# Patient Record
Sex: Male | Born: 1950 | Race: White | Hispanic: No
Health system: Southern US, Community
[De-identification: ages and names within clinical notes are randomized; demographics above are authoritative.]

---

## 2018-07-26 ENCOUNTER — Encounter: Payer: Self-pay | Admitting: Radiology

## 2018-07-26 ENCOUNTER — Emergency Department
Admission: EM | Admit: 2018-07-26 | Discharge: 2018-07-26 | Disposition: A | Discharge status: Home / Self Care | Payer: Medicare PPO | Attending: Emergency Medicine | Admitting: Emergency Medicine

## 2018-07-26 ENCOUNTER — Other Ambulatory Visit: Payer: Self-pay

## 2018-07-26 ENCOUNTER — Emergency Department: Payer: Medicare PPO

## 2018-07-26 DIAGNOSIS — N200 Calculus of kidney: Secondary | ICD-10-CM | POA: Diagnosis not present

## 2018-07-26 DIAGNOSIS — E039 Hypothyroidism, unspecified: Secondary | ICD-10-CM | POA: Diagnosis not present

## 2018-07-26 DIAGNOSIS — R1032 Left lower quadrant pain: Secondary | ICD-10-CM | POA: Diagnosis present

## 2018-07-26 LAB — CBC WITH DIFFERENTIAL/PLATELET
BASOS PCT: 1 %
Basophils Absolute: 0 10*3/uL (ref 0–0.1)
EOS ABS: 0.1 10*3/uL (ref 0–0.7)
Eosinophils Relative: 1 %
HCT: 47 % (ref 40.0–52.0)
HEMOGLOBIN: 16.5 g/dL (ref 13.0–18.0)
Lymphocytes Relative: 19 %
Lymphs Abs: 1.2 10*3/uL (ref 1.0–3.6)
MCH: 31.9 pg (ref 26.0–34.0)
MCHC: 35.1 g/dL (ref 32.0–36.0)
MCV: 90.8 fL (ref 80.0–100.0)
MONOS PCT: 9 %
Monocytes Absolute: 0.6 10*3/uL (ref 0.2–1.0)
NEUTROS PCT: 70 %
Neutro Abs: 4.5 10*3/uL (ref 1.4–6.5)
PLATELETS: 260 10*3/uL (ref 150–440)
RBC: 5.17 MIL/uL (ref 4.40–5.90)
RDW: 13.6 % (ref 11.5–14.5)
WBC: 6.4 10*3/uL (ref 3.8–10.6)

## 2018-07-26 LAB — BASIC METABOLIC PANEL
ANION GAP: 6 (ref 5–15)
BUN: 15 mg/dL (ref 8–23)
CALCIUM: 8.5 mg/dL — AB (ref 8.9–10.3)
CO2: 26 mmol/L (ref 22–32)
CREATININE: 1.14 mg/dL (ref 0.61–1.24)
Chloride: 107 mmol/L (ref 98–111)
GLUCOSE: 112 mg/dL — AB (ref 70–99)
Potassium: 3.9 mmol/L (ref 3.5–5.1)
Sodium: 139 mmol/L (ref 135–145)

## 2018-07-26 LAB — HEPATIC FUNCTION PANEL
ALT: 22 U/L (ref 0–44)
AST: 26 U/L (ref 15–41)
Albumin: 3.7 g/dL (ref 3.5–5.0)
Alkaline Phosphatase: 53 U/L (ref 38–126)
BILIRUBIN DIRECT: 0.1 mg/dL (ref 0.0–0.2)
BILIRUBIN INDIRECT: 0.7 mg/dL (ref 0.3–0.9)
BILIRUBIN TOTAL: 0.8 mg/dL (ref 0.3–1.2)
Total Protein: 6.9 g/dL (ref 6.5–8.1)

## 2018-07-26 LAB — URINALYSIS, COMPLETE (UACMP) WITH MICROSCOPIC
BACTERIA UA: NONE SEEN
BILIRUBIN URINE: NEGATIVE
GLUCOSE, UA: NEGATIVE mg/dL
Ketones, ur: NEGATIVE mg/dL
LEUKOCYTES UA: NEGATIVE
Nitrite: NEGATIVE
PROTEIN: NEGATIVE mg/dL
Specific Gravity, Urine: 1.026 (ref 1.005–1.030)
Squamous Epithelial / LPF: NONE SEEN (ref 0–5)
pH: 5 (ref 5.0–8.0)

## 2018-07-26 LAB — LIPASE, BLOOD: Lipase: 25 U/L (ref 11–51)

## 2018-07-26 MED ORDER — HYDROCODONE-ACETAMINOPHEN 5-325 MG PO TABS: 1.0000 | ORAL_TABLET | Freq: Four times a day (QID) | ORAL | 0 refills | Status: AC | PRN

## 2018-07-26 MED ORDER — TAMSULOSIN HCL 0.4 MG PO CAPS: 0.4000 mg | ORAL_CAPSULE | Freq: Every day | ORAL | 0 refills | Status: AC

## 2018-07-26 MED ORDER — IOPAMIDOL (ISOVUE-300) INJECTION 61%
30.0000 mL | Freq: Once | INTRAVENOUS | Status: AC | PRN
  Administered 2018-07-26: 30 mL via ORAL

## 2018-07-26 MED ORDER — ONDANSETRON 4 MG PO TBDP: 4.0000 mg | ORAL_TABLET | Freq: Four times a day (QID) | ORAL | 0 refills | Status: AC | PRN

## 2018-07-26 MED ORDER — IOPAMIDOL (ISOVUE-300) INJECTION 61%
100.0000 mL | Freq: Once | INTRAVENOUS | Status: AC | PRN
  Administered 2018-07-26: 100 mL via INTRAVENOUS

## 2018-07-26 MED ORDER — NAPROXEN 500 MG PO TABS
250.0000 mg | ORAL_TABLET | Freq: Once | ORAL | Status: AC
  Administered 2018-07-26: 250 mg via ORAL
  Filled 2018-07-26: qty 1

## 2018-07-26 NOTE — ED Triage Notes (Signed)
Pt ambulatory to triage with no difficulty. Pt reports left flank and abd pain that started yesterday as a feeling of gas. Pt reports pain has become more intense. Hx of kidney stones. Feels similar.

## 2018-07-26 NOTE — ED Notes (Signed)
Pt called out stating he finished drinking his contrast. CT notified. Pt denies any needs at this time.

## 2018-07-26 NOTE — Discharge Instructions (Addendum)
You have been seen in the Emergency Department (ED) today for pain that we believe based on your workup, is caused by kidney stones.  As we have discussed, please drink plenty of fluids.  Please make a follow up appointment with the physician(s) listed elsewhere in this documentation. ° °You may take pain medication as needed but ONLY as prescribed.  Please also take your prescribed Flomax daily.  We also recommend that you take over-the-counter ibuprofen regularly according to label instructions over the next 5 days.  Take it with meals to minimize stomach discomfort. ° °Please see your doctor as soon as possible as stones may take 1-3 weeks to pass and you may require additional care or medications. ° °Do not drink alcohol, drive or participate in any other potentially dangerous activities while taking opiate pain medication as it may make you sleepy. Do not take this medication with any other sedating medications, either prescription or over-the-counter. If you were prescribed Percocet or Vicodin, do not take these with acetaminophen (Tylenol) as it is already contained within these medications. °  °This medication is an opiate (or narcotic) pain medication and can be habit forming.  Use it as little as possible to achieve adequate pain control.  Do not use or use it with extreme caution if you have a history of opiate abuse or dependence.  If you are on a pain contract with your primary care doctor or a pain specialist, be sure to let them know you were prescribed this medication today from the [facility].  This medication is intended for your use only - do not give any to anyone else and keep it in a secure place where nobody else, especially children, have access to it.  It will also cause or worsen constipation, so you may want to consider taking an over-the-counter stool softener while you are taking this medication. ° °Return to the Emergency Department (ED) or call your doctor  if you have any worsening pain, fever, painful urination, are unable to urinate, or develop other symptoms that concern you. ° °

## 2018-07-26 NOTE — ED Notes (Signed)
Pt reports he developed a sharp pain to left flank area yesterday reports it went away and woke up this morning around 0400 with sharp pain, reports history of kidney stones reports pain feels similar to prior experiences. Pt talks in complete sentences no distress noted denies any blood in urine denies any N/V or D.

## 2018-07-26 NOTE — ED Provider Notes (Signed)
[facility] Emergency Department Provider Note   ____________________________________________   First MD Initiated Contact with Patient 07/26/18 [phone]     (approximate)  I have reviewed the triage vital signs and the nursing notes.   HISTORY  Chief Complaint Abdominal Pain and Flank Pain    HPI Chris Mcfarland is a 67 y.o. male reports abdominal pain for about 2 days  Left mid to left lower abdomen.  Been ongoing for about 2 days, somewhat crampy in nature.  Taking Naprosyn with moderate relief.  Reports he would prefer to be able to drive home, currently not in a lot of pain but is slightly uncomfortable.  Reports similar he thinks about 5 years ago when he had a kidney stone that required stenting for removal.  Denies a history of diverticulitis.  Does frequent diving, no recent dives.  Previous appendectomy 40 years ago.  Pain is somewhat sharp at times.  No nausea vomiting.  Small but normal bowel movement yesterday.  No black or bloody stool.  No history of ulcers.  Taking Synthroid   History reviewed. No pertinent past medical history.  Hypothyroidism  Myalgias  Prior seizures  There are no active problems to display for this patient.     Prior to Admission medications   Medication Sig Start Date End Date Taking? Authorizing Provider  HYDROcodone-acetaminophen (NORCO/VICODIN) 5-325 MG tablet Take 1 tablet by mouth every 6 (six) hours as needed for moderate pain. 07/26/18   Sharyn Creamer, MD  ondansetron (ZOFRAN ODT) 4 MG disintegrating tablet Take 1 tablet (4 mg total) by mouth every 6 (six) hours as needed for nausea or vomiting. 07/26/18   Sharyn Creamer, MD  tamsulosin (FLOMAX) 0.4 MG CAPS capsule Take 1 capsule (0.4 mg total) by mouth daily. 07/26/18   Sharyn Creamer, MD    Allergies Aspirin  No family history on file.  Social History Social History   Tobacco Use  . Smoking status: Not on file  Substance Use Topics  . Alcohol use: Not on  file  . Drug use: Not on file  Denies illicit drug use, denies overuse of alcohol  Review of Systems Constitutional: No fever/chills Eyes: No visual changes. ENT: No sore throat. Cardiovascular: Denies chest pain. Respiratory: Denies shortness of breath. Gastrointestinal: No nausea, no vomiting.  No diarrhea.  No constipation. Genitourinary: Negative for dysuria. Musculoskeletal: Negative for back pain. Skin: Negative for rash. Neurological: Negative for headaches, focal weakness or numbness.    ____________________________________________   PHYSICAL EXAM:  VITAL SIGNS: ED Triage Vitals  Enc Vitals Group     BP 07/26/18 0615 (!) 143/81     Pulse Rate 07/26/18 0615 61     Resp 07/26/18 0615 17     Temp 07/26/18 0615 98.1 F (36.7 C)     Temp Source 07/26/18 0615 Oral     SpO2 07/26/18 0615 96 %     Weight 07/26/18 0608 199 lb (90.3 kg)     Height 07/26/18 0608 5\' 7"  (1.702 m)     Head Circumference --      Peak Flow --      Pain Score 07/26/18 0608 6     Pain Loc --      Pain Edu? --      Excl. in GC? --     Constitutional: Alert and oriented. Well appearing and in no acute distress. Eyes: Conjunctivae are normal. Head: Atraumatic. Nose: No congestion/rhinnorhea. Mouth/Throat: Mucous membranes are moist. Neck: No stridor.  Cardiovascular: Normal rate, regular rhythm. Grossly normal heart sounds.  Good peripheral circulation. Respiratory: Normal respiratory effort.  No retractions. Lungs CTAB. Gastrointestinal: Soft and nontender except for mild tenderness over the left lower quadrant and left flank.. No distention.  No rebound or guarding any quadrant.  No midline mass. Musculoskeletal: No lower extremity tenderness nor edema. Neurologic:  Normal speech and language. No gross focal neurologic deficits are appreciated.  Skin:  Skin is warm, dry and intact. No rash noted. Psychiatric: Mood and affect are normal. Speech and behavior are  normal.  ____________________________________________   LABS (all labs ordered are listed, but only abnormal results are displayed)  Labs Reviewed  URINALYSIS, COMPLETE (UACMP) WITH MICROSCOPIC - Abnormal; Notable for the following components:      Result Value   Color, Urine AMBER (*)    APPearance HAZY (*)    Hgb urine dipstick LARGE (*)    RBC / HPF >50 (*)    All other components within normal limits  BASIC METABOLIC PANEL - Abnormal; Notable for the following components:   Glucose, Bld 112 (*)    Calcium 8.5 (*)    All other components within normal limits  CBC WITH DIFFERENTIAL/PLATELET  LIPASE, BLOOD  HEPATIC FUNCTION PANEL   ____________________________________________  EKG    No associated cardiac symptoms.  Denies chest pain. ____________________________________________  RADIOLOGY  Findings reviewed by me  Ct Abdomen Pelvis W Contrast   IMPRESSION: Distal left ureteral stone measuring 4 mm with obstructive change. Mild scarring in the left kidney is noted. Tiny nonobstructing right renal stone best seen on the coronal imaging. Diverticulosis without diverticulitis. Electronically Signed   By: Alcide CleverMark  Lukens M.D.   On: 07/26/2018 09:02    ____________________________________________   PROCEDURES  Procedure(s) performed: None  Procedures  Critical Care performed: No  ____________________________________________   INITIAL IMPRESSION / ASSESSMENT AND PLAN / ED COURSE  Pertinent labs & imaging results that were available during my care of the patient were reviewed by me and considered in my medical decision making (see chart for details).  Differential diagnosis includes but is not limited to, abdominal perforation, aortic dissection, cholecystitis, appendicitis, diverticulitis, colitis, esophagitis/gastritis, kidney stone, pyelonephritis, urinary tract infection, aortic aneurysm. All are considered in decision and treatment plan. Based upon the  patient's presentation and risk factors, I suspect most likely etiology is kidney stone or potentially mild diverticulitis based on clinical presentation and exam proceed with CT abdomen pelvis.  Does not have any evidence of an acute abdomen.  Resting comfortably.  Hemodynamically stable.  Discussed plan to proceed with pain control with Naprosyn for which she is does have an allergy to aspirin, but reports he takes Naprosyn at home daily without incident.   ----------------------------------------- 9:24 AM on 07/26/2018 -----------------------------------------  Patient reports pain improved.  Currently reports 1 out of 10 discomfort.  Reviewed diagnosis of kidney stone, treatment follow-up and careful return precautions.  I will prescribe the patient a narcotic pain medicine due to their condition which I anticipate will cause at least moderate pain short term. I discussed with the patient safe use of narcotic pain medicines, and that they are not to drive, work in dangerous areas, or ever take more than prescribed (no more than 1 pill every 6 hours). We discussed that this is the type of medication that can be  overdosed on and the risks of this type of medicine. Patient is very agreeable to only use as prescribed and to never use more than prescribed.  Return precautions and treatment recommendations and follow-up discussed with the patient who is agreeable with the plan.       ____________________________________________   FINAL CLINICAL IMPRESSION(S) / ED DIAGNOSES  Final diagnoses:  Kidney stone  Kidney stone on left side      NEW MEDICATIONS STARTED DURING THIS VISIT:  New Prescriptions   HYDROCODONE-ACETAMINOPHEN (NORCO/VICODIN) 5-325 MG TABLET    Take 1 tablet by mouth every 6 (six) hours as needed for moderate pain.   ONDANSETRON (ZOFRAN ODT) 4 MG DISINTEGRATING TABLET    Take 1 tablet (4 mg total) by mouth every 6 (six) hours as needed for nausea or vomiting.    TAMSULOSIN (FLOMAX) 0.4 MG CAPS CAPSULE    Take 1 capsule (0.4 mg total) by mouth daily.     Note:  This document was prepared using Dragon voice recognition software and may include unintentional dictation errors.     Sharyn Creamer, MD 07/26/18 [phone]

## 2018-08-29 ENCOUNTER — Other Ambulatory Visit: Payer: Self-pay | Admitting: Internal Medicine

## 2018-08-29 DIAGNOSIS — R1084 Generalized abdominal pain: Secondary | ICD-10-CM

## 2018-09-04 ENCOUNTER — Ambulatory Visit
Admission: RE | Admit: 2018-09-04 | Discharge: 2018-09-04 | Disposition: A | Discharge status: Home / Self Care | Payer: Medicare PPO | Source: Ambulatory Visit | Attending: Internal Medicine | Admitting: Internal Medicine

## 2018-09-04 DIAGNOSIS — K760 Fatty (change of) liver, not elsewhere classified: Secondary | ICD-10-CM | POA: Diagnosis not present

## 2018-09-04 DIAGNOSIS — R1084 Generalized abdominal pain: Secondary | ICD-10-CM | POA: Diagnosis present

## 2019-10-08 IMAGING — CT CT ABD-PELV W/ CM
2 of 5 series · 16 of 46 positions shown, 18 images · IV contrast (APPLIED)
Comparison: None.

CLINICAL DATA: Left-sided abdominal pain for 2 days

EXAM:
CT ABDOMEN AND PELVIS WITH CONTRAST
TECHNIQUE: Multidetector CT imaging of the abdomen and pelvis was performed
using the standard protocol following bolus administration of
intravenous contrast.
CONTRAST:  100mL [id number] IOPAMIDOL ([id number]) INJECTION 61%

[Series 2: routine abd/pel with · axial · 0.82mm/px · z∈[-500,-40]mm · 13 of 104 slices shown, 15 images]
[im 6/104  soft-tissue]
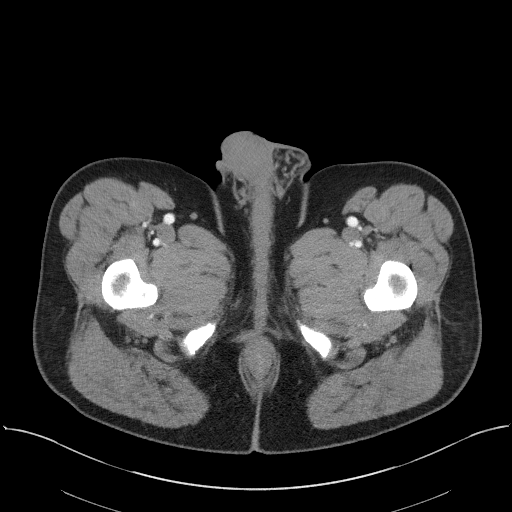
[im 6/104  bone]
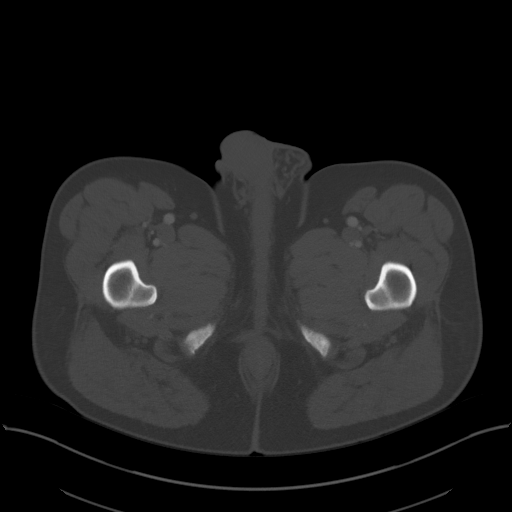
[im 17/104  soft-tissue]
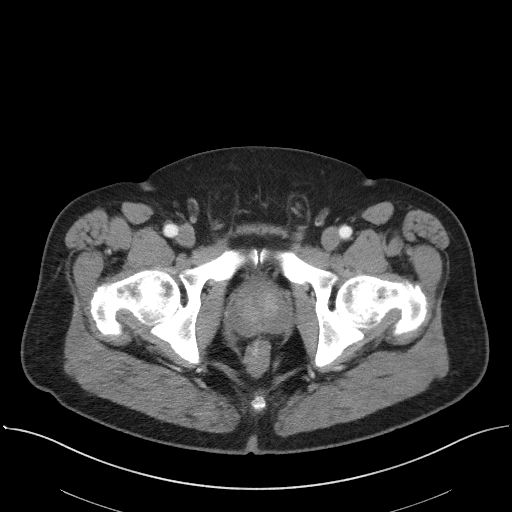
[im 22/104  soft-tissue]
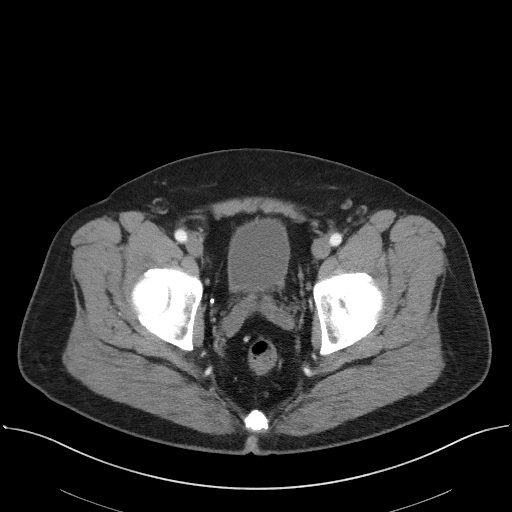
[im 28/104  soft-tissue]
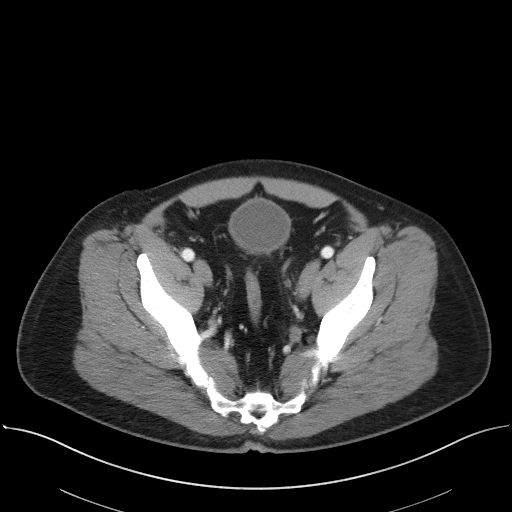
[im 38/104  soft-tissue]
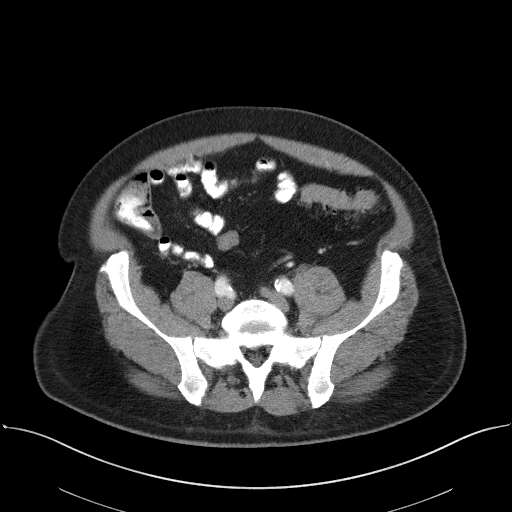
[im 44/104  soft-tissue]
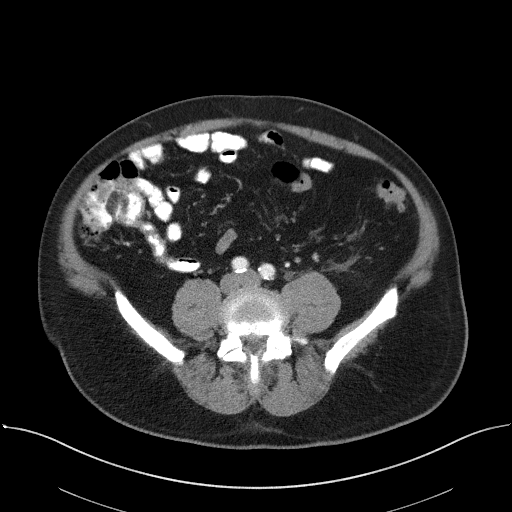
[im 55/104  soft-tissue]
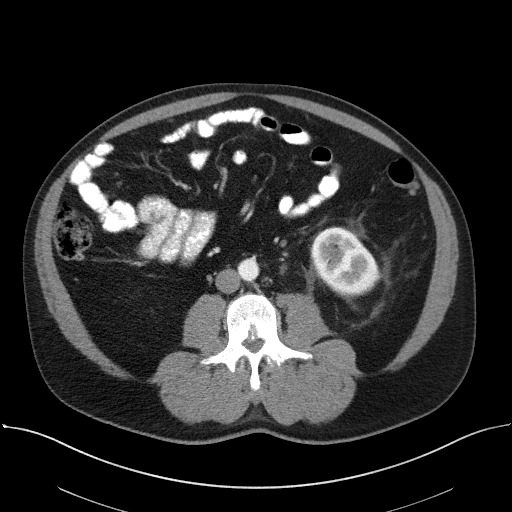
[im 60/104  soft-tissue]
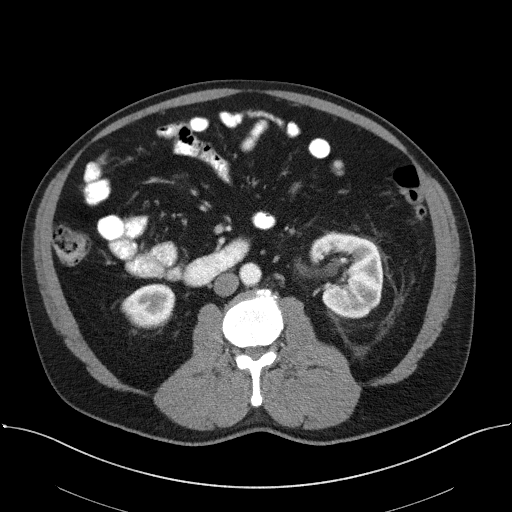
[im 66/104  soft-tissue]
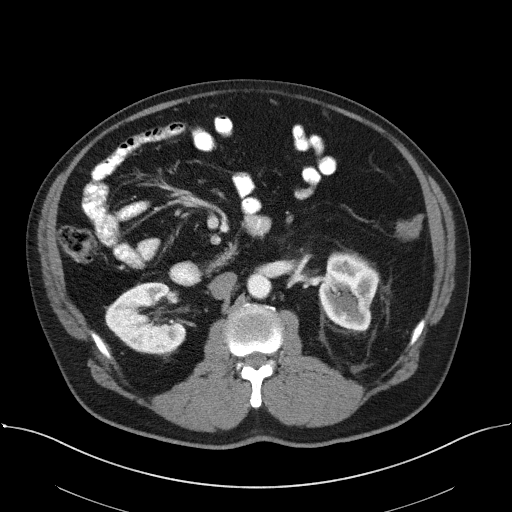
[im 66/104  bone]
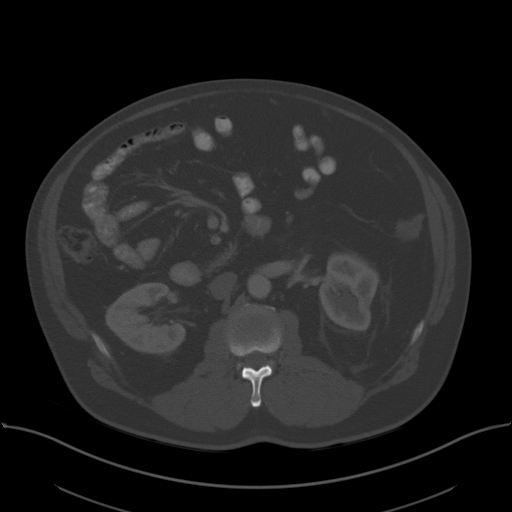
[im 76/104  soft-tissue]
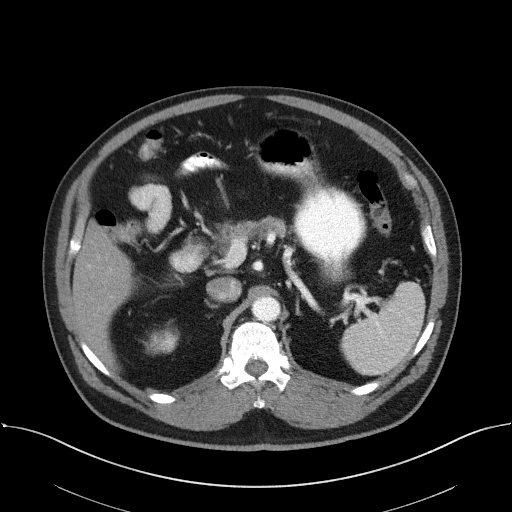
[im 82/104  soft-tissue]
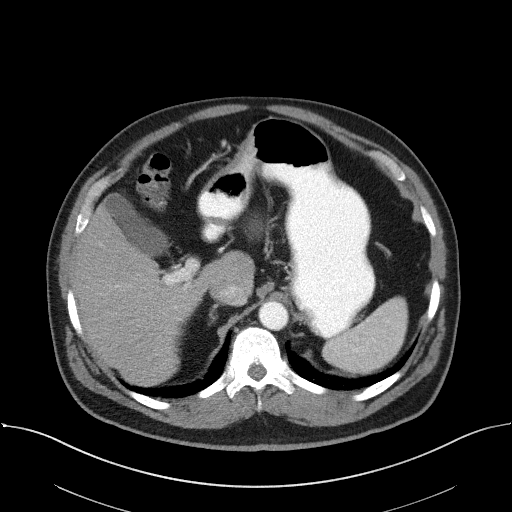
[im 87/104  soft-tissue]
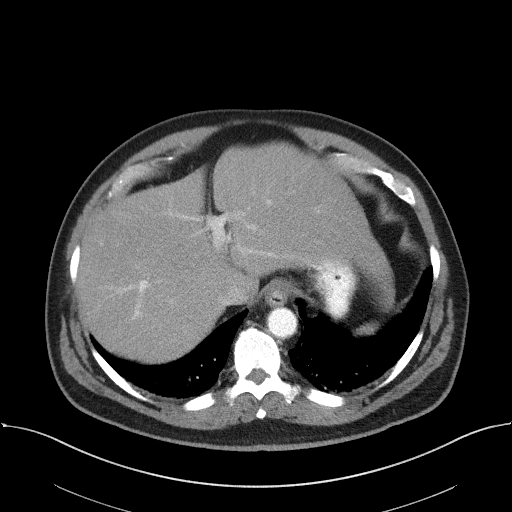
[im 98/104  soft-tissue]
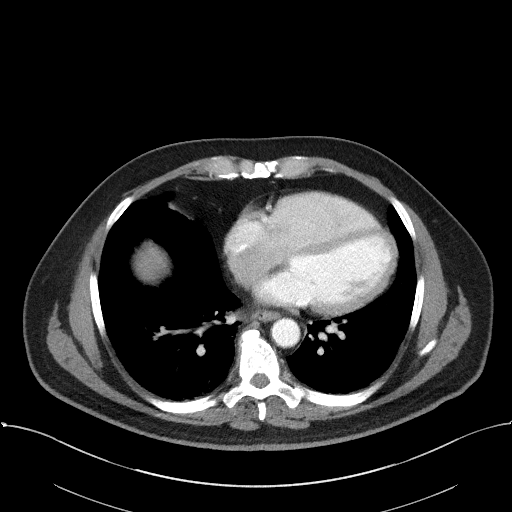

[Series 6: coronal st · coronal · 0.77mm/px · 3 of 103 slices shown]
[im 35/103  soft-tissue]
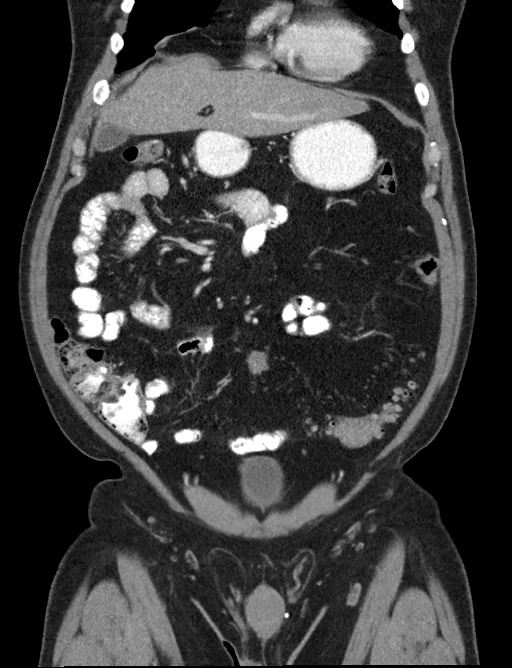
[im 46/103  soft-tissue]
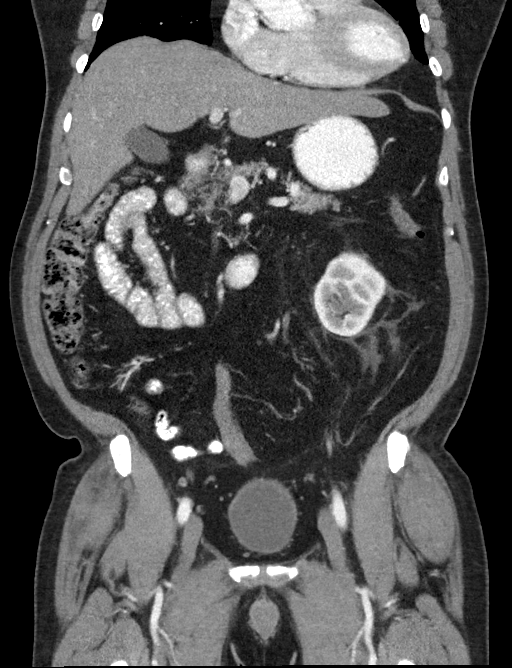
[im 57/103  soft-tissue]
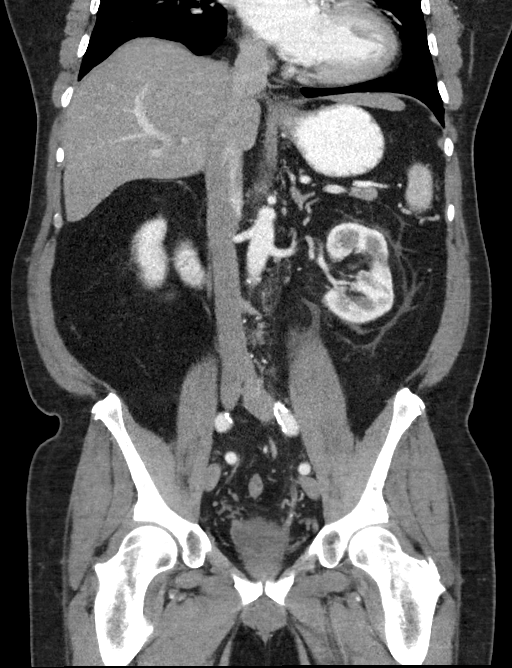

[16 of 46 positions shown; findings below may reference images not displayed]

FINDINGS: Lower chest: No acute abnormality.

Hepatobiliary: Fatty infiltration of the liver is noted. The
gallbladder is within normal limits.

Pancreas: Unremarkable. No pancreatic ductal dilatation or
surrounding inflammatory changes.

Spleen: Normal in size without focal abnormality.

Adrenals/Urinary Tract: Adrenal glands are within normal limits
bilaterally. Kidneys are well visualized bilaterally within normal
enhancement pattern. Some scarring is noted in the left kidney. Left
perinephric stranding is noted as well as mild hydronephrosis. This
extends into the left ureter to just above the ureterovesical
junction on the left and which point there is a 4 mm obstructing
stone identified best seen on image number 81 of series 2. The right
renal collecting system shows a tiny nonobstructing stone. No other
focal abnormality is noted. The bladder is partially distended

Stomach/Bowel: Scattered diverticular change without evidence of
diverticulitis. The appendix is not well visualized. No inflammatory
changes are identified to suggest appendicitis. Small bowel is
within normal limits. Stomach is unremarkable.

Vascular/Lymphatic: Aortic atherosclerosis. No enlarged abdominal or
pelvic lymph nodes.

Reproductive: Prostate is unremarkable.

Other: No abdominal wall hernia or abnormality. No abdominopelvic
ascites.

Musculoskeletal: No acute or significant osseous findings.
IMPRESSION: Distal left ureteral stone measuring 4 mm with obstructive change.

Mild scarring in the left kidney is noted.

Tiny nonobstructing right renal stone best seen on the coronal
imaging.

Diverticulosis without diverticulitis.

## 2020-07-11 IMAGING — US US ABDOMEN COMPLETE
1 series · 14 of 25 positions shown · non-contrast
Comparison: 07/26/2018 CT

CLINICAL DATA: 67-year-old male with acute epigastric and abdominal
pain.

EXAM:
ABDOMEN ULTRASOUND COMPLETE

[Series 1: us abdomen complete · 0.19mm/px · 14 of 99 slices shown]
[im 1/99]
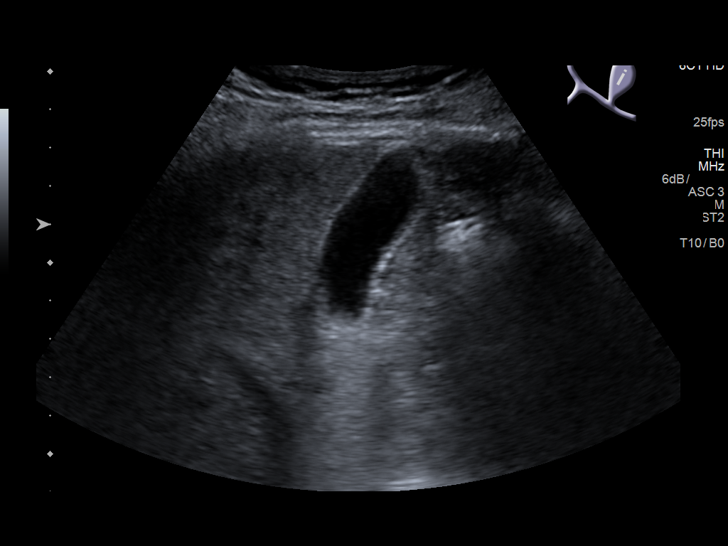
[im 9/99]
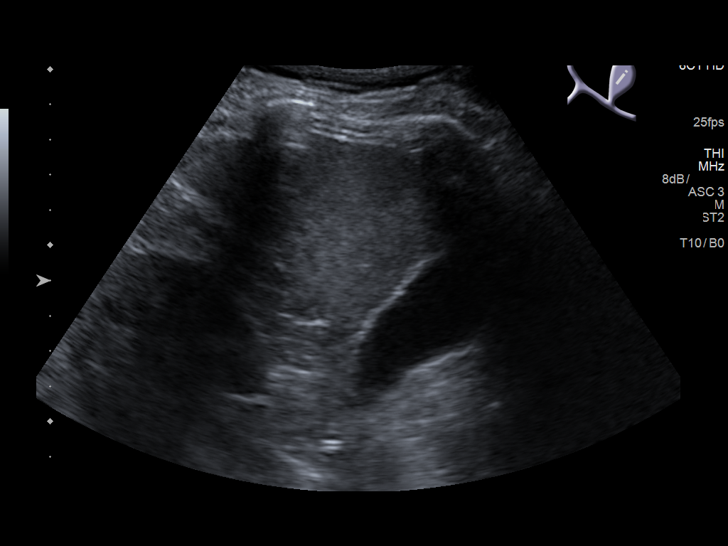
[im 17/99]
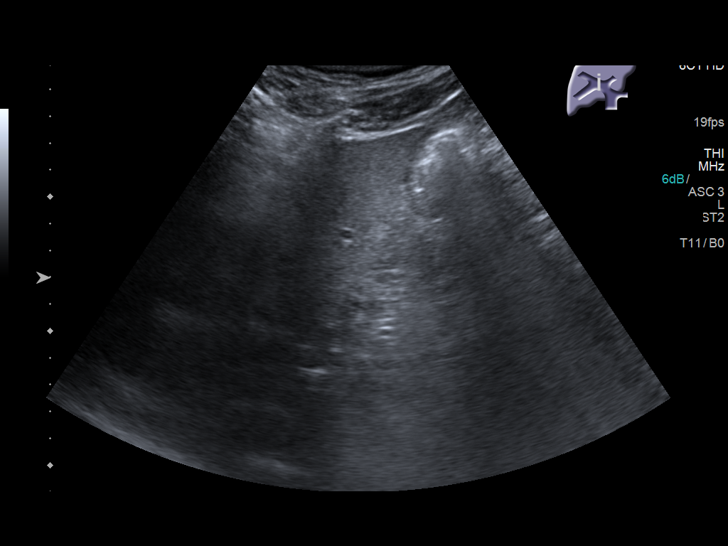
[im 25/99]
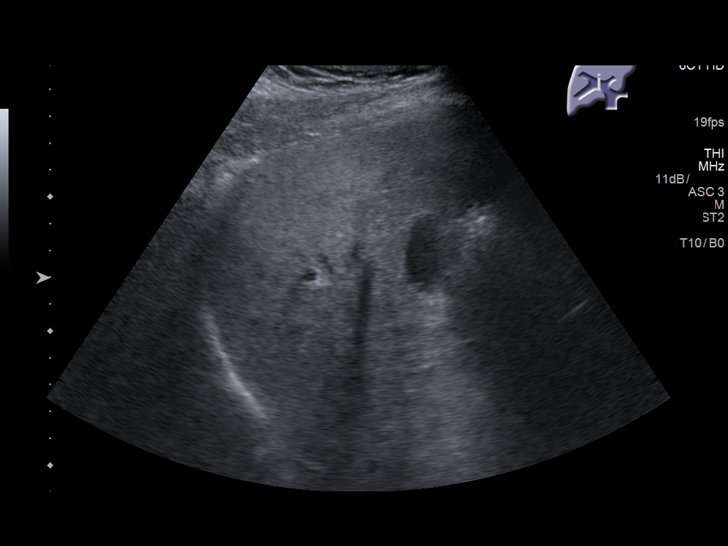
[im 33/99]
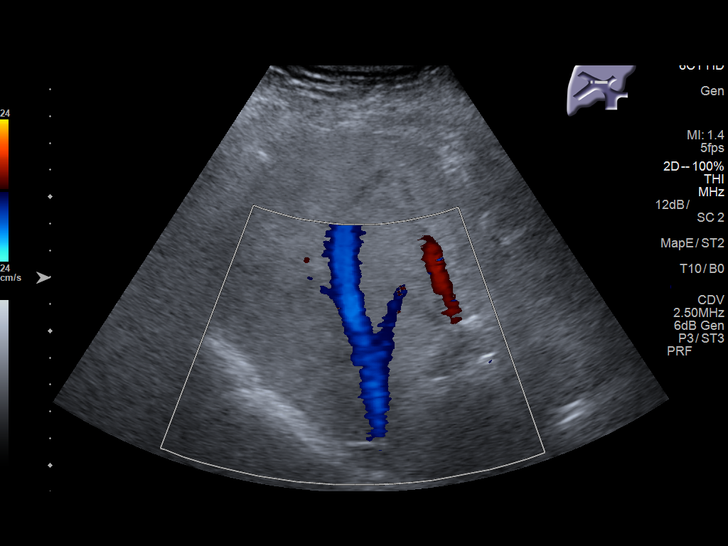
[im 37/99]
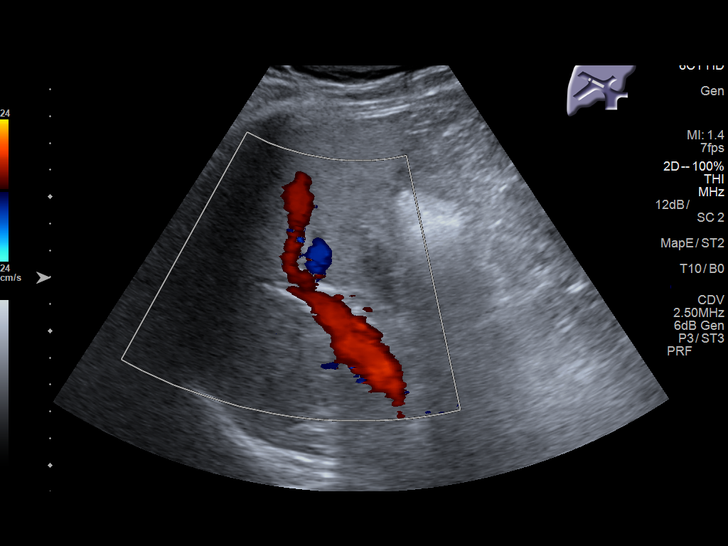
[im 45/99]
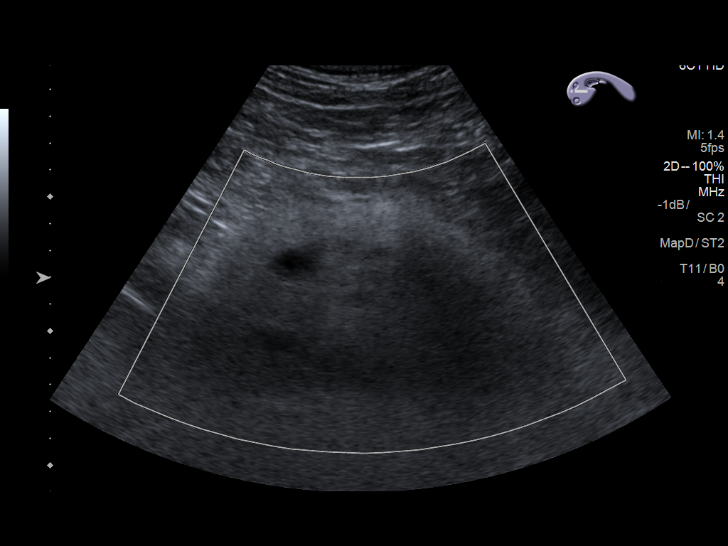
[im 54/99]
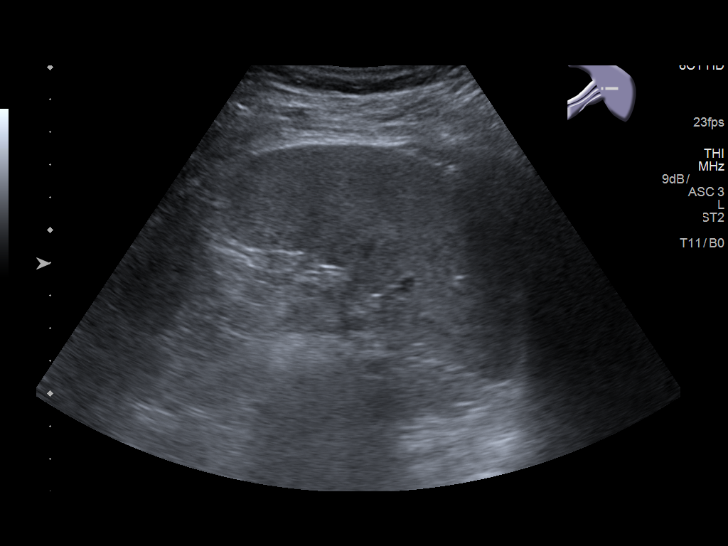
[im 62/99]
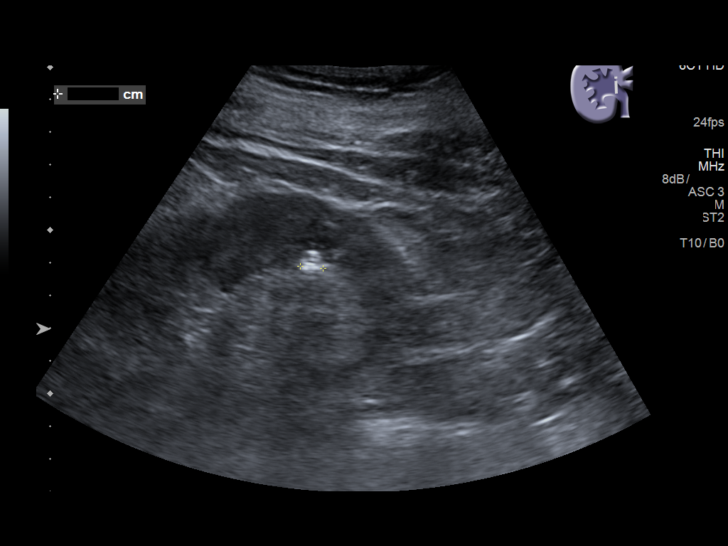
[im 66/99]
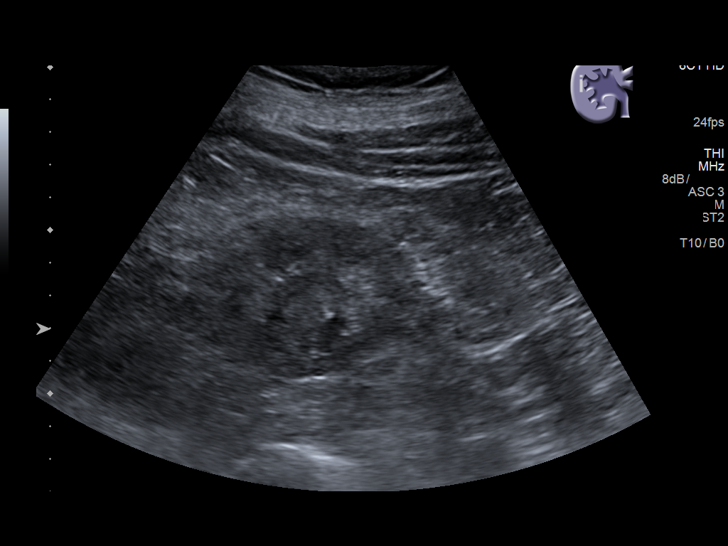
[im 74/99]
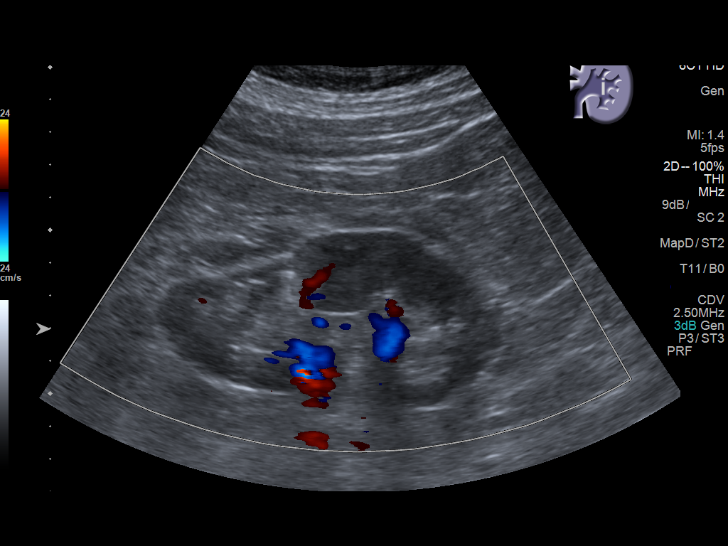
[im 82/99]
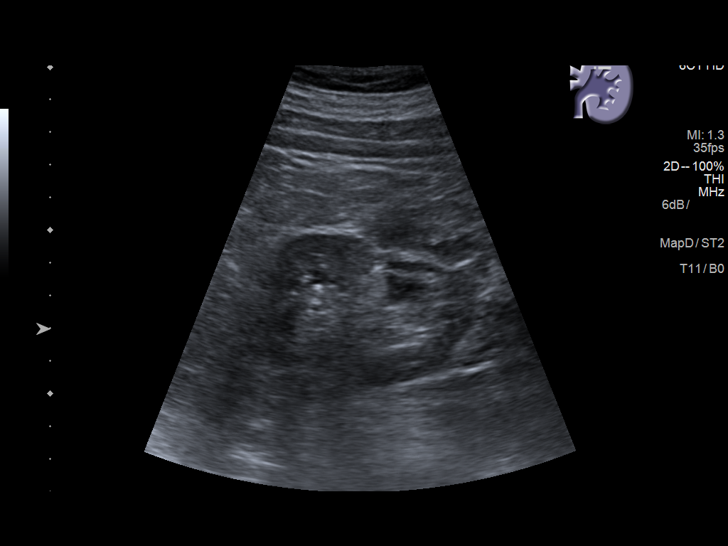
[im 90/99]
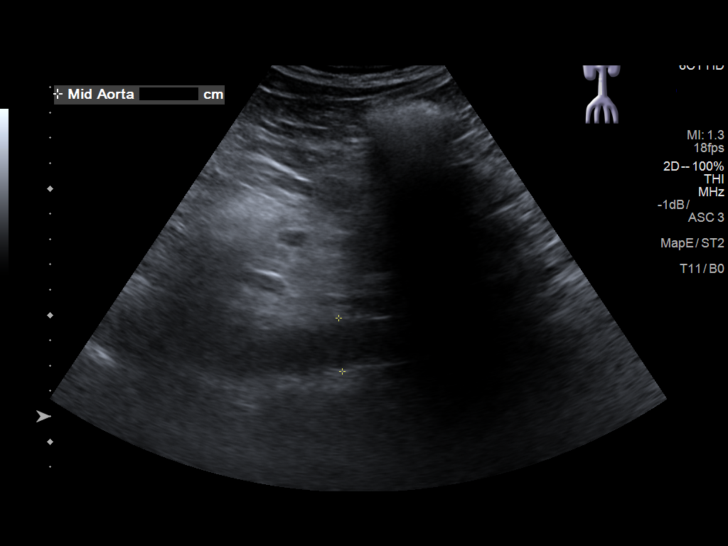
[im 99/99]
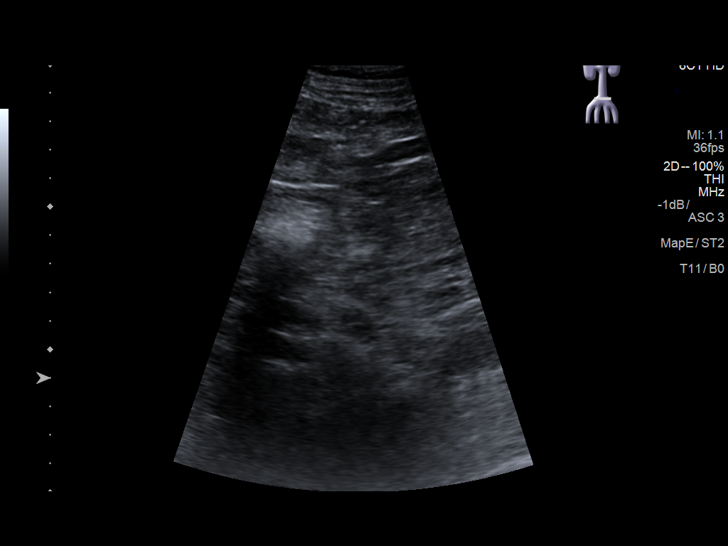

[14 of 25 positions shown; findings below may reference images not displayed]

FINDINGS: Gallbladder: The gallbladder is unremarkable. No evidence of
cholelithiasis or acute cholecystitis.

Common bile duct: Diameter: 3.6 mm. No intrahepatic or extrahepatic
biliary dilatation.

Liver: Slightly increased hepatic echogenicity noted compatible with
mild hepatic steatosis. No focal hepatic abnormalities are present.
Portal vein is patent on color Doppler imaging with normal direction
of blood flow towards the liver.

IVC: No abnormality visualized.

Pancreas: Visualized portion unremarkable.

Spleen: Size and appearance within normal limits.

Right Kidney: Length: 10.5 cm. Echogenicity within normal limits. No
mass or hydronephrosis visualized. No definite calculi noted.

Left Kidney: Length: 11.4 cm with mid renal scar. Echogenicity
within normal limits. No mass or hydronephrosis visualized. No
definite calculi noted.

Abdominal aorta: No aneurysm visualized.

Other findings: None.
IMPRESSION: 1. No acute abnormality. Unremarkable gallbladder. No biliary
dilatation.
2. Mild hepatic steatosis

## 2023-06-11 ENCOUNTER — Ambulatory Visit: Payer: Medicare Other

## 2023-06-11 DIAGNOSIS — Z09 Encounter for follow-up examination after completed treatment for conditions other than malignant neoplasm: Secondary | ICD-10-CM

## 2023-06-11 DIAGNOSIS — K642 Third degree hemorrhoids: Secondary | ICD-10-CM

## 2023-06-11 DIAGNOSIS — K5730 Diverticulosis of large intestine without perforation or abscess without bleeding: Secondary | ICD-10-CM

## 2023-06-11 DIAGNOSIS — Z86010 Personal history of colonic polyps: Secondary | ICD-10-CM

## 2024-08-19 ENCOUNTER — Other Ambulatory Visit: Payer: Self-pay | Admitting: Medical Genetics

## 2024-08-20 ENCOUNTER — Other Ambulatory Visit
Admission: RE | Admit: 2024-08-20 | Discharge: 2024-08-20 | Disposition: A | Discharge status: Home / Self Care | Payer: Self-pay | Source: Ambulatory Visit | Attending: Medical Genetics | Admitting: Medical Genetics

## 2024-08-30 LAB — GENECONNECT MOLECULAR SCREEN: Genetic Analysis Overall Interpretation: NEGATIVE
